# Patient Record
Sex: Female | Born: 1965
Health system: Southern US, Community
[De-identification: ages and names within clinical notes are randomized; demographics above are authoritative.]

## PROBLEM LIST (undated history)

## (undated) DIAGNOSIS — Z789 Other specified health status: Secondary | ICD-10-CM

## (undated) HISTORY — DX: Other specified health status: Z78.9

---

## 2004-05-15 ENCOUNTER — Emergency Department (HOSPITAL_COMMUNITY): Admission: EM | Admit: 2004-05-15 | Discharge: 2004-05-15 | Payer: Self-pay | Admitting: Emergency Medicine

## 2006-08-26 ENCOUNTER — Ambulatory Visit: Payer: Self-pay | Admitting: Obstetrics & Gynecology

## 2006-08-26 ENCOUNTER — Encounter: Payer: Self-pay | Admitting: Obstetrics & Gynecology

## 2006-08-26 ENCOUNTER — Encounter: Payer: Self-pay | Admitting: Obstetrics and Gynecology

## 2006-08-26 ENCOUNTER — Other Ambulatory Visit: Admission: RE | Admit: 2006-08-26 | Discharge: 2006-08-26 | Payer: Self-pay | Admitting: Obstetrics & Gynecology

## 2006-09-01 ENCOUNTER — Ambulatory Visit (HOSPITAL_COMMUNITY): Admission: RE | Admit: 2006-09-01 | Discharge: 2006-09-01 | Payer: Self-pay | Admitting: Family Medicine

## 2006-09-16 ENCOUNTER — Ambulatory Visit: Payer: Self-pay | Admitting: Obstetrics & Gynecology

## 2019-10-03 ENCOUNTER — Telehealth: Payer: Self-pay | Admitting: General Practice

## 2019-10-03 NOTE — Telephone Encounter (Signed)
Paz ok'd new patient appointment  

## 2019-10-05 ENCOUNTER — Other Ambulatory Visit: Payer: Self-pay

## 2019-10-27 ENCOUNTER — Encounter: Payer: Self-pay | Admitting: Internal Medicine

## 2019-10-27 ENCOUNTER — Ambulatory Visit: Payer: 59 | Admitting: Internal Medicine

## 2019-10-27 ENCOUNTER — Other Ambulatory Visit: Payer: Self-pay

## 2019-10-27 VITALS — BP 144/90 | HR 64 | Temp 97.8°F | Resp 16 | Ht 63.0 in | Wt 163.0 lb

## 2019-10-27 DIAGNOSIS — Z0000 Encounter for general adult medical examination without abnormal findings: Secondary | ICD-10-CM

## 2019-10-27 DIAGNOSIS — R202 Paresthesia of skin: Secondary | ICD-10-CM

## 2019-10-27 DIAGNOSIS — Z1231 Encounter for screening mammogram for malignant neoplasm of breast: Secondary | ICD-10-CM

## 2019-10-27 DIAGNOSIS — Z23 Encounter for immunization: Secondary | ICD-10-CM | POA: Diagnosis not present

## 2019-10-27 DIAGNOSIS — Z780 Asymptomatic menopausal state: Secondary | ICD-10-CM

## 2019-10-27 DIAGNOSIS — Z01419 Encounter for gynecological examination (general) (routine) without abnormal findings: Secondary | ICD-10-CM

## 2019-10-27 LAB — COMPREHENSIVE METABOLIC PANEL
ALT: 18 U/L (ref 0–35)
AST: 22 U/L (ref 0–37)
Albumin: 4.5 g/dL (ref 3.5–5.2)
Alkaline Phosphatase: 116 U/L (ref 39–117)
BUN: 11 mg/dL (ref 6–23)
CO2: 32 mEq/L (ref 19–32)
Calcium: 9.8 mg/dL (ref 8.4–10.5)
Chloride: 101 mEq/L (ref 96–112)
Creatinine, Ser: 0.53 mg/dL (ref 0.40–1.20)
GFR: 119.62 mL/min (ref >60.00)
Glucose, Bld: 91 mg/dL (ref 70–99)
Potassium: 3.8 mEq/L (ref 3.5–5.1)
Sodium: 139 mEq/L (ref 135–145)
Total Bilirubin: 0.4 mg/dL (ref 0.2–1.2)
Total Protein: 7.3 g/dL (ref 6.0–8.3)

## 2019-10-27 LAB — CBC WITH DIFFERENTIAL/PLATELET
Basophils Absolute: 0 10*3/uL (ref 0.0–0.1)
Basophils Relative: 0.8 % (ref 0.0–3.0)
Eosinophils Absolute: 0.2 10*3/uL (ref 0.0–0.7)
Eosinophils Relative: 3.6 % (ref 0.0–5.0)
HCT: 39.5 % (ref 36.0–46.0)
Hemoglobin: 13.4 g/dL (ref 12.0–15.0)
Lymphocytes Relative: 30.2 % (ref 12.0–46.0)
Lymphs Abs: 1.4 10*3/uL (ref 0.7–4.0)
MCHC: 34 g/dL (ref 30.0–36.0)
MCV: 87.3 fl (ref 78.0–100.0)
Monocytes Absolute: 0.4 10*3/uL (ref 0.1–1.0)
Monocytes Relative: 7.8 % (ref 3.0–12.0)
Neutro Abs: 2.7 10*3/uL (ref 1.4–7.7)
Neutrophils Relative %: 57.6 % (ref 43.0–77.0)
Platelets: 170 10*3/uL (ref 150.0–400.0)
RBC: 4.53 Mil/uL (ref 3.87–5.11)
RDW: 13.6 % (ref 11.5–15.5)
WBC: 4.6 10*3/uL (ref 4.0–10.5)

## 2019-10-27 LAB — B12 AND FOLATE PANEL
Folate: >24.8 ng/mL (ref >5.9)
Vitamin B-12: 848 pg/mL (ref 211–911)

## 2019-10-27 LAB — HEMOGLOBIN A1C: Hgb A1c MFr Bld: 5.7 % (ref 4.6–6.5)

## 2019-10-27 LAB — LIPID PANEL
Cholesterol: 242 mg/dL — ABNORMAL HIGH (ref 0–200)
HDL: 66 mg/dL (ref >39.00)
LDL Cholesterol: 138 mg/dL — ABNORMAL HIGH (ref 0–99)
NonHDL: 176.08
Total CHOL/HDL Ratio: 4
Triglycerides: 191 mg/dL — ABNORMAL HIGH (ref 0.0–149.0)
VLDL: 38.2 mg/dL (ref 0.0–40.0)

## 2019-10-27 LAB — VITAMIN D 25 HYDROXY (VIT D DEFICIENCY, FRACTURES): VITD: 29.47 ng/mL — ABNORMAL LOW (ref 30.00–100.00)

## 2019-10-27 LAB — TSH: TSH: 1.17 u[IU]/mL (ref 0.35–4.50)

## 2019-10-27 NOTE — Progress Notes (Signed)
   Subjective:    Patient ID: Brandy Hopkins, female    DOB: 1965-10-17, 54 y.o.   MRN: [id number]  DOS:  10/27/2019 Type of visit - description: New patient, CPX Has not seen a doctor in years. In general feels well. Several year history of occasionally numbness at feet, on and off, sometimes is positional depending if she is kneeling or sitting for long time. No claudication, no edema, from time to time has low back pain, worse on the left. She is on her menopause, denies spotting.    Review of Systems  Other than above, a 14 point review of systems is negative     Past Medical History:  Diagnosis Date  . Known health problems: none     Past Surgical History:  Procedure Laterality Date  . CESAREAN SECTION  1997    Allergies as of 10/27/2019   No Known Allergies     Medication List    as of October 27, 2019 11:59 PM   You have not been prescribed any medications.        Objective:   Physical Exam BP (!) 144/90 (BP Location: Left Arm)   Pulse 64   Temp 97.8 F (36.6 C) (Temporal)   Resp 16   Ht 5\' 3"  (1.6 m)   Wt 163 lb (73.9 kg)   SpO2 99%   BMI 28.87 kg/m  General: Well developed, NAD, BMI noted Neck: No  thyromegaly  HEENT:  Normocephalic . Face symmetric, atraumatic Lungs:  CTA B Normal respiratory effort, no intercostal retractions, no accessory muscle use. Heart: RRR,  no murmur.  Abdomen:  Not distended, soft, non-tender. No rebound or rigidity.   Lower extremities: no pretibial edema bilaterally.  Good pedal pulses Skin: Exposed areas without rash. Not pale. Not jaundice Neurologic:  alert & oriented X3.  Speech normal, gait appropriate for age and unassisted Strength symmetric and appropriate for age.  DTRs symmetric, pinprick examination distal lower extremities normal Psych: Cognition and judgment appear intact.  Cooperative with normal attention span and concentration.  Behavior appropriate. No anxious or depressed appearing.      Assessment    Assessment (new patient 10-2019 Menopausal (LMP ~ 2015)  PLAN New patient, here for CPX Paresthesias: Chronic (years), see above in addition to routine labs will check vitamin levels.  Otherwise observation BP elevated: Recheck 144/90, reassess in 6 months instructions discussed in Spanish RTC 6 months  This visit occurred during the SARS-CoV-2 public health emergency.  Safety protocols were in place, including screening questions prior to the visit, additional usage of staff PPE, and extensive cleaning of exam room while observing appropriate contact time as indicated for disinfecting solutions.

## 2019-10-27 NOTE — Patient Instructions (Signed)
TOMESE LA PRESION UNA VEZ AL MES, SU PRESION DEBE SER ENTRE 110/65 and  135/85.  ME AVISA SI NO ES NORMAL  VAYA AL LABORATORIO  SAQUE UNA CITA PARA OTRA VISITA EN 6 MESES  VAYA AL PRIMER PISO: HAGA UNA CITA PARA SU DENSIOMETRIA OSEA Y MAMOGRAMA  LA VAN A LLAMAR PRA UNA CITA CON EL GINECOLOGO  TRAIGA UNA MUESTRA DE LAS HECES    GO TO THE LAB : Get the blood work     GO TO THE FRONT DESK, PLEASE SCHEDULE YOUR APPOINTMENTS Come back for a checkup in 6 months

## 2019-10-29 ENCOUNTER — Encounter: Payer: Self-pay | Admitting: Internal Medicine

## 2019-10-29 DIAGNOSIS — Z0000 Encounter for general adult medical examination without abnormal findings: Secondary | ICD-10-CM | POA: Insufficient documentation

## 2019-10-29 NOTE — Assessment & Plan Note (Signed)
Tdap today S/p Pfizer covid x 2  rec flu shot q year CCS: no FH, for now recommend ifob Female Care : not in years, refer to gyn, order mammogram Menopausal: rx ca , vit D, DEXA rec eye check Labs: CMP, FLP, CBC, A1c, TSH, vitamin D, B12, folic acid

## 2019-10-31 MED ORDER — VITAMIN D (ERGOCALCIFEROL) 1.25 MG (50000 UNIT) PO CAPS: 50000.0000 [IU] | ORAL_CAPSULE | ORAL | 0 refills | Status: DC

## 2019-10-31 NOTE — Addendum Note (Signed)
Addended byConrad [place] D on: 10/31/2019 11:03 AM   Modules accepted: Orders

## 2019-11-07 ENCOUNTER — Ambulatory Visit (HOSPITAL_BASED_OUTPATIENT_CLINIC_OR_DEPARTMENT_OTHER)
Admission: RE | Admit: 2019-11-07 | Discharge: 2019-11-07 | Disposition: A | Discharge status: Home / Self Care | Payer: 59 | Source: Ambulatory Visit | Attending: Internal Medicine | Admitting: Internal Medicine

## 2019-11-07 ENCOUNTER — Other Ambulatory Visit: Payer: Self-pay

## 2019-11-07 ENCOUNTER — Encounter (HOSPITAL_BASED_OUTPATIENT_CLINIC_OR_DEPARTMENT_OTHER): Payer: Self-pay

## 2019-11-07 DIAGNOSIS — Z1231 Encounter for screening mammogram for malignant neoplasm of breast: Secondary | ICD-10-CM | POA: Diagnosis present

## 2019-11-07 DIAGNOSIS — Z780 Asymptomatic menopausal state: Secondary | ICD-10-CM | POA: Diagnosis present

## 2019-11-13 MED FILL — VIT D2 1.25 MG (50,000 UNIT: 1.25 MG | 28 days supply | Qty: 4 | Fill #0

## 2019-12-07 LAB — HM PAP SMEAR

## 2020-03-08 ENCOUNTER — Encounter: Payer: Self-pay | Admitting: Internal Medicine

## 2020-04-29 ENCOUNTER — Encounter: Payer: Self-pay | Admitting: Internal Medicine

## 2020-04-29 ENCOUNTER — Other Ambulatory Visit: Payer: Self-pay

## 2020-04-29 ENCOUNTER — Ambulatory Visit (INDEPENDENT_AMBULATORY_CARE_PROVIDER_SITE_OTHER): Payer: 59 | Admitting: Internal Medicine

## 2020-04-29 VITALS — BP 160/89 | HR 72 | Temp 98.0°F | Resp 16 | Ht 63.0 in | Wt 169.0 lb

## 2020-04-29 DIAGNOSIS — E559 Vitamin D deficiency, unspecified: Secondary | ICD-10-CM

## 2020-04-29 DIAGNOSIS — R030 Elevated blood-pressure reading, without diagnosis of hypertension: Secondary | ICD-10-CM

## 2020-04-29 DIAGNOSIS — G629 Polyneuropathy, unspecified: Secondary | ICD-10-CM

## 2020-04-29 DIAGNOSIS — R202 Paresthesia of skin: Secondary | ICD-10-CM | POA: Diagnosis not present

## 2020-04-29 NOTE — Progress Notes (Signed)
° °  Subjective:    Patient ID: Brandy Hopkins, female    DOB: [birth year], 55 y.o.   MRN: [id number]  DOS:  04/29/2020 Type of visit - description: Follow-up, here with her husband Since the last office visit she is doing about the same. Ambulatory BP occasionally elevated.  Denies chest pain or difficulty breathing. No edema No headaches    Review of Systems See above   Past Medical History:  Diagnosis Date   Known health problems: none     Past Surgical History:  Procedure Laterality Date   CESAREAN SECTION  1997    Allergies as of 04/29/2020   No Known Allergies     Medication List       Accurate as of April 29, 2020  4:18 PM. If you have any questions, ask your nurse or doctor.        Vitamin D (Ergocalciferol) 1.25 MG (50000 UNIT) Caps capsule Commonly known as: DRISDOL Take 1 capsule (50,000 Units total) by mouth every 7 (seven) days.          Objective:   Physical Exam BP (!) 160/89 (BP Location: Right Arm, Patient Position: Sitting, Cuff Size: Small)    Pulse 72    Temp 98 F (36.7 C) (Oral)    Resp 16    Ht 5\' 3"  (1.6 m)    Wt 169 lb (76.7 kg)    SpO2 98%    BMI 29.94 kg/m  General:   Well developed, NAD, BMI noted. HEENT:  Normocephalic . Face symmetric, atraumatic Lungs:  CTA B Normal respiratory effort, no intercostal retractions, no accessory muscle use. Heart: RRR,  no murmur.  Lower extremities: no pretibial edema bilaterally  Skin: Not pale. Not jaundice Neurologic:  alert & oriented X3.  Speech normal, gait appropriate for age and unassisted Psych--  Cognition and judgment appear intact.  Cooperative with normal attention span and concentration.  Behavior appropriate. No anxious or depressed appearing.      Assessment    Assessment (new patient 10-2019) Menopausal (LMP ~ 2015) Vitamin D deficiency   PLAN Elevated BP: BP today slightly elevated, at home range from 135-140. Plan: Continue monitoring weekly, low-salt  diet, reassess in 6 months Vit D  deficiency: See labs from few months ago, took ergocalciferol weekly for 3 months, recommend OTC vitamin D3 2000 units daily.  Recheck levels on RTC Paresthesias: At baseline Preventive care: Saw gynecology, had a Pap smear, normal mammogram and DEXA 10/2019. Had 2 Covid vaccines, benefits of booster discussed. Declined need flu vaccination. RTC 6 months    This visit occurred during the SARS-CoV-2 public health emergency.  Safety protocols were in place, including screening questions prior to the visit, additional usage of staff PPE, and extensive cleaning of exam room while observing appropriate contact time as indicated for disinfecting solutions.

## 2020-04-29 NOTE — Progress Notes (Signed)
Pre visit review using our clinic review tool, if applicable. No additional management support is needed unless otherwise documented below in the visit note. 

## 2020-04-29 NOTE — Patient Instructions (Signed)
Check the  blood pressure  weekly   BP GOAL is between 110/65 and  135/85. If it is consistently higher or lower, let me know  TOME VITAMINA D-3:  2,000 UNIDADES CADA DIA   GO TO THE FRONT DESK, PLEASE SCHEDULE YOUR APPOINTMENTS Come back for a check up in 6 months

## 2020-04-30 DIAGNOSIS — Z09 Encounter for follow-up examination after completed treatment for conditions other than malignant neoplasm: Secondary | ICD-10-CM | POA: Insufficient documentation

## 2020-04-30 NOTE — Assessment & Plan Note (Signed)
Elevated BP: BP today slightly elevated, at home range from 135-140. Plan: Continue monitoring weekly, low-salt diet, reassess in 6 months Vit D  deficiency: See labs from few months ago, took ergocalciferol weekly for 3 months, recommend OTC vitamin D3 2000 units daily.  Recheck levels on RTC Paresthesias: At baseline Preventive care: Saw gynecology, had a Pap smear, normal mammogram and DEXA 10/2019. Had 2 Covid vaccines, benefits of booster discussed. Declined need flu vaccination. RTC 6 months

## 2020-10-29 ENCOUNTER — Ambulatory Visit: Payer: 59 | Admitting: Internal Medicine

## 2020-11-19 ENCOUNTER — Other Ambulatory Visit (HOSPITAL_BASED_OUTPATIENT_CLINIC_OR_DEPARTMENT_OTHER): Payer: Self-pay

## 2020-11-19 ENCOUNTER — Encounter: Payer: Self-pay | Admitting: Internal Medicine

## 2020-11-19 ENCOUNTER — Other Ambulatory Visit: Payer: Self-pay

## 2020-11-19 ENCOUNTER — Ambulatory Visit (INDEPENDENT_AMBULATORY_CARE_PROVIDER_SITE_OTHER): Payer: 59 | Admitting: Internal Medicine

## 2020-11-19 VITALS — BP 142/98 | HR 75 | Temp 98.2°F | Resp 16 | Ht 63.0 in | Wt 169.1 lb

## 2020-11-19 DIAGNOSIS — Z1231 Encounter for screening mammogram for malignant neoplasm of breast: Secondary | ICD-10-CM

## 2020-11-19 DIAGNOSIS — E559 Vitamin D deficiency, unspecified: Secondary | ICD-10-CM

## 2020-11-19 DIAGNOSIS — I10 Essential (primary) hypertension: Secondary | ICD-10-CM

## 2020-11-19 DIAGNOSIS — R739 Hyperglycemia, unspecified: Secondary | ICD-10-CM

## 2020-11-19 DIAGNOSIS — Z1159 Encounter for screening for other viral diseases: Secondary | ICD-10-CM

## 2020-11-19 DIAGNOSIS — Z114 Encounter for screening for human immunodeficiency virus [HIV]: Secondary | ICD-10-CM

## 2020-11-19 DIAGNOSIS — Z0000 Encounter for general adult medical examination without abnormal findings: Secondary | ICD-10-CM | POA: Diagnosis not present

## 2020-11-19 MED ORDER — AMLODIPINE BESYLATE 5 MG PO TABS
5.0000 mg | ORAL_TABLET | Freq: Every day | ORAL | 1 refills | Status: DC
  Filled 2020-11-19: qty 90, 90d supply, fill #0
  Filled 2020-12-02: qty 30, 30d supply, fill #0

## 2020-11-19 NOTE — Patient Instructions (Addendum)
  Empieze amlodipine 5 mg una al dia para presion [place] la presion 2 veces x semana  110/65 and  135/85. Dieta baja de sal   En el primer piso haga una cita para su mamografia  Regrese en 3 meses para un chequeo de presion   Considere la vacuna de el flu y shingles  (culebrilla)     GO TO THE LAB : Get the blood work     GO TO THE FRONT DESK, PLEASE SCHEDULE YOUR APPOINTMENTS Come back for a checkup in 3 months

## 2020-11-19 NOTE — Progress Notes (Signed)
Subjective:    Patient ID: Brandy Hopkins, female    DOB: 06-Oct-1965, 55 y.o.   MRN: [id number]  DOS:  11/19/2020 Type of visit - description: CPX Here with her husband. Has no major concerns. Ambulatory BP slightly elevated.   BP Readings from Last 3 Encounters:  11/19/20 (!) 142/98  04/29/20 (!) 160/89  10/27/19 (!) 144/90    Review of Systems  Other than above, a 14 point review of systems is negative     Past Medical History:  Diagnosis Date   Known health problems: none     Past Surgical History:  Procedure Laterality Date   CESAREAN SECTION  1997   Social History   Socioeconomic History   Marital status: Married    Spouse name: Not on file   Number of children: 2   Years of education: Not on file   Highest education level: Not on file  Occupational History   Occupation: house wife  Tobacco Use   Smoking status: Never   Smokeless tobacco: Never  Substance and Sexual Activity   Alcohol use: Never   Drug use: Never   Sexual activity: Yes  Other Topics Concern   Not on file  Social History Narrative   From [place]   First marriage at age 36   Social Determinants of Health   Financial Resource Strain: Not on file  Food Insecurity: Not on file  Transportation Needs: Not on file  Physical Activity: Not on file  Stress: Not on file  Social Connections: Not on file  Intimate Partner Violence: Not on file    Allergies as of 11/19/2020   No Known Allergies      Medication List        Accurate as of November 19, 2020 11:59 PM. If you have any questions, ask your nurse or doctor.          amLODipine 5 MG tablet Commonly known as: NORVASC Tome 1 tableta (5 mg en total) por va oral diariamente. (Take 1 tablet (5 mg total) by mouth daily.) Started by: Willow Ora, MD           Objective:   Physical Exam BP (!) 142/98 (BP Location: Left Arm, Patient Position: Sitting, Cuff Size: Small)   Pulse 75   Temp 98.2 F (36.8 C) (Oral)    Resp 16   Ht 5\' 3"  (1.6 m)   Wt 169 lb 2 oz (76.7 kg)   SpO2 97%   BMI 29.96 kg/m  General: Well developed, NAD, BMI noted Neck: No  thyromegaly  HEENT:  Normocephalic . Face symmetric, atraumatic Lungs:  CTA B Normal respiratory effort, no intercostal retractions, no accessory muscle use. Heart: RRR,  no murmur.  Abdomen:  Not distended, soft, non-tender. No rebound or rigidity.   Lower extremities: no pretibial edema bilaterally  Skin: Exposed areas without rash. Not pale. Not jaundice Neurologic:  alert & oriented X3.  Speech normal, gait appropriate for age and unassisted Strength symmetric and appropriate for age.  Psych: Cognition and judgment appear intact.  Cooperative with normal attention span and concentration.  Behavior appropriate. No anxious or depressed appearing.     Assessment     Assessment (new patient 10-2019) Hyperglycemia HTN: Dx 10-2020 Menopausal (LMP ~ 2015) Vitamin D deficiency   PLAN Here for CPX Hyperglycemia: Check A1c HTN: BP remains moderately (but consistently) elevated.  DX HTN.  Recommend low-salt diet, start amlodipine 5 mg, reassess in 3 months. Vitamin D deficiency: On OTCs,  recheck today. All instructions discussed in Spanish RTC 3 months   This visit occurred during the SARS-CoV-2 public health emergency.  Safety protocols were in place, including screening questions prior to the visit, additional usage of staff PPE, and extensive cleaning of exam room while observing appropriate contact time as indicated for disinfecting solutions.

## 2020-11-20 ENCOUNTER — Encounter: Payer: Self-pay | Admitting: Internal Medicine

## 2020-11-20 DIAGNOSIS — E559 Vitamin D deficiency, unspecified: Secondary | ICD-10-CM | POA: Insufficient documentation

## 2020-11-20 DIAGNOSIS — I10 Essential (primary) hypertension: Secondary | ICD-10-CM | POA: Insufficient documentation

## 2020-11-20 LAB — CBC WITH DIFFERENTIAL/PLATELET
Basophils Absolute: 0.1 10*3/uL (ref 0.0–0.1)
Basophils Relative: 1.1 % (ref 0.0–3.0)
Eosinophils Absolute: 0.2 10*3/uL (ref 0.0–0.7)
Eosinophils Relative: 3.4 % (ref 0.0–5.0)
HCT: 38.4 % (ref 36.0–46.0)
Hemoglobin: 12.8 g/dL (ref 12.0–15.0)
Lymphocytes Relative: 33 % (ref 12.0–46.0)
Lymphs Abs: 1.8 10*3/uL (ref 0.7–4.0)
MCHC: 33.5 g/dL (ref 30.0–36.0)
MCV: 86.1 fl (ref 78.0–100.0)
Monocytes Absolute: 0.4 10*3/uL (ref 0.1–1.0)
Monocytes Relative: 6.8 % (ref 3.0–12.0)
Neutro Abs: 3.1 10*3/uL (ref 1.4–7.7)
Neutrophils Relative %: 55.7 % (ref 43.0–77.0)
Platelets: 182 10*3/uL (ref 150.0–400.0)
RBC: 4.46 Mil/uL (ref 3.87–5.11)
RDW: 13.6 % (ref 11.5–15.5)
WBC: 5.6 10*3/uL (ref 4.0–10.5)

## 2020-11-20 LAB — LIPID PANEL
Cholesterol: 246 mg/dL — ABNORMAL HIGH (ref 0–200)
HDL: 64.5 mg/dL (ref >39.00)
LDL Cholesterol: 150 mg/dL — ABNORMAL HIGH (ref 0–99)
NonHDL: 181.96
Total CHOL/HDL Ratio: 4
Triglycerides: 162 mg/dL — ABNORMAL HIGH (ref 0.0–149.0)
VLDL: 32.4 mg/dL (ref 0.0–40.0)

## 2020-11-20 LAB — COMPREHENSIVE METABOLIC PANEL
ALT: 17 U/L (ref 0–35)
AST: 21 U/L (ref 0–37)
Albumin: 4.4 g/dL (ref 3.5–5.2)
Alkaline Phosphatase: 130 U/L — ABNORMAL HIGH (ref 39–117)
BUN: 13 mg/dL (ref 6–23)
CO2: 32 mEq/L (ref 19–32)
Calcium: 9.5 mg/dL (ref 8.4–10.5)
Chloride: 103 mEq/L (ref 96–112)
Creatinine, Ser: 0.58 mg/dL (ref 0.40–1.20)
GFR: 101.18 mL/min (ref >60.00)
Glucose, Bld: 88 mg/dL (ref 70–99)
Potassium: 3.7 mEq/L (ref 3.5–5.1)
Sodium: 141 mEq/L (ref 135–145)
Total Bilirubin: 0.3 mg/dL (ref 0.2–1.2)
Total Protein: 7.2 g/dL (ref 6.0–8.3)

## 2020-11-20 LAB — HEMOGLOBIN A1C: Hgb A1c MFr Bld: 5.7 % (ref 4.6–6.5)

## 2020-11-20 LAB — HIV ANTIBODY (ROUTINE TESTING W REFLEX): HIV 1&2 Ab, 4th Generation: NONREACTIVE

## 2020-11-20 LAB — HEPATITIS C ANTIBODY
Hepatitis C Ab: NONREACTIVE
SIGNAL TO CUT-OFF: 0.01 (ref <1.00)

## 2020-11-20 LAB — VITAMIN D 25 HYDROXY (VIT D DEFICIENCY, FRACTURES): VITD: 37.17 ng/mL (ref 30.00–100.00)

## 2020-11-20 NOTE — Assessment & Plan Note (Signed)
Here for CPX Hyperglycemia: Check A1c HTN: BP remains moderately (but consistently) elevated.  DX HTN.  Recommend low-salt diet, start amlodipine 5 mg, reassess in 3 months. Vitamin D deficiency: On OTCs, recheck today. All instructions discussed in Spanish RTC 3 months

## 2020-11-20 NOTE — Assessment & Plan Note (Signed)
Tdap 2021 S/p Pfizer covid x 2, rec booster Shingrix recommended rec flu shot q year CCS: no FH, options d/w and her husband, elected  ifob Female Care : PAP 11-2019, MMG 10-2019.  MMG order entered. Menopausal: rx ca , vit D, DEXA 10/2019 WNL Labs: CMP, FLP, vitamin D, CBC, A1c, hep C, HIV, I fob  Diet and exercise discussed

## 2020-11-27 ENCOUNTER — Other Ambulatory Visit (HOSPITAL_BASED_OUTPATIENT_CLINIC_OR_DEPARTMENT_OTHER): Payer: Self-pay

## 2020-11-29 ENCOUNTER — Other Ambulatory Visit (INDEPENDENT_AMBULATORY_CARE_PROVIDER_SITE_OTHER): Payer: 59

## 2020-11-29 DIAGNOSIS — Z0000 Encounter for general adult medical examination without abnormal findings: Secondary | ICD-10-CM | POA: Diagnosis not present

## 2020-11-29 LAB — FECAL OCCULT BLOOD, IMMUNOCHEMICAL: Fecal Occult Bld: NEGATIVE

## 2020-12-02 ENCOUNTER — Other Ambulatory Visit (HOSPITAL_BASED_OUTPATIENT_CLINIC_OR_DEPARTMENT_OTHER): Payer: Self-pay

## 2020-12-10 ENCOUNTER — Other Ambulatory Visit: Payer: Self-pay

## 2020-12-10 ENCOUNTER — Ambulatory Visit (HOSPITAL_BASED_OUTPATIENT_CLINIC_OR_DEPARTMENT_OTHER)
Admission: RE | Admit: 2020-12-10 | Discharge: 2020-12-10 | Disposition: A | Discharge status: Home / Self Care | Payer: 59 | Source: Ambulatory Visit | Attending: Internal Medicine | Admitting: Internal Medicine

## 2020-12-10 ENCOUNTER — Encounter (HOSPITAL_BASED_OUTPATIENT_CLINIC_OR_DEPARTMENT_OTHER): Payer: Self-pay

## 2020-12-10 DIAGNOSIS — Z1231 Encounter for screening mammogram for malignant neoplasm of breast: Secondary | ICD-10-CM | POA: Insufficient documentation

## 2021-02-19 ENCOUNTER — Ambulatory Visit: Payer: 59 | Admitting: Internal Medicine

## 2021-02-19 ENCOUNTER — Encounter: Payer: Self-pay | Admitting: Internal Medicine

## 2021-03-04 ENCOUNTER — Other Ambulatory Visit (HOSPITAL_BASED_OUTPATIENT_CLINIC_OR_DEPARTMENT_OTHER): Payer: Self-pay

## 2021-03-04 ENCOUNTER — Other Ambulatory Visit: Payer: Self-pay

## 2021-03-04 ENCOUNTER — Ambulatory Visit (INDEPENDENT_AMBULATORY_CARE_PROVIDER_SITE_OTHER): Payer: 59 | Admitting: Internal Medicine

## 2021-03-04 VITALS — BP 142/81 | HR 62 | Temp 98.3°F | Resp 12 | Ht 63.0 in | Wt 163.4 lb

## 2021-03-04 DIAGNOSIS — I10 Essential (primary) hypertension: Secondary | ICD-10-CM | POA: Diagnosis not present

## 2021-03-04 DIAGNOSIS — R7989 Other specified abnormal findings of blood chemistry: Secondary | ICD-10-CM | POA: Diagnosis not present

## 2021-03-04 MED ORDER — LOSARTAN POTASSIUM 50 MG PO TABS
50.0000 mg | ORAL_TABLET | Freq: Every day | ORAL | 0 refills | Status: DC
  Filled 2021-03-04: qty 30, 30d supply, fill #0

## 2021-03-04 NOTE — Progress Notes (Signed)
   Subjective:    Patient ID: Brandy Hopkins, female    DOB: 1966/04/07, 55 y.o.   MRN: [id number]  DOS:  03/04/2021 Type of visit - description: f/u  Since the last office visit, was rx  amlodipine for hypertension. Took it only for 1 month, developed paresthesias at B arms  and cramps at the calves; while on amlodipine, BPs were good.  She stopped amlodipine and all "side effects" went away. Denies any lower extremity edema.  BP Readings from Last 3 Encounters:  03/04/21 (!) 142/81  11/19/20 (!) 142/98  04/29/20 (!) 160/89     Review of Systems See above   Past Medical History:  Diagnosis Date   Known health problems: none     Past Surgical History:  Procedure Laterality Date   CESAREAN SECTION  1997    Allergies as of 03/04/2021   No Known Allergies      Medication List        Accurate as of March 04, 2021  7:20 PM. If you have any questions, ask your nurse or doctor.          amLODipine 5 MG tablet Commonly known as: NORVASC Tome 1 tableta (5 mg en total) por va oral diariamente. (Take 1 tablet (5 mg total) by mouth daily.)   losartan 50 MG tablet Commonly known as: COZAAR Tome 1 tableta (50 mg en total) por va oral diariamente. (Take 1 tablet (50 mg total) by mouth daily.) Started by: Willow Ora, MD           Objective:   Physical Exam BP (!) 142/81 (BP Location: Right Arm, Cuff Size: Large)   Pulse 62   Temp 98.3 F (36.8 C) (Oral)   Resp 12   Ht 5\' 3"  (1.6 m)   Wt 163 lb 6.4 oz (74.1 kg)   SpO2 98%   BMI 28.95 kg/m  General:   Well developed, NAD, BMI noted. HEENT:  Normocephalic . Face symmetric, atraumatic Lungs:  CTA B Normal respiratory effort, no intercostal retractions, no accessory muscle use. Heart: RRR,  no murmur.  Lower extremities: no pretibial edema bilaterally  Skin: Not pale. Not jaundice Neurologic:  alert & oriented X3.  Speech normal, gait appropriate for age and unassisted Psych--  Cognition and  judgment appear intact.  Cooperative with normal attention span and concentration.  Behavior appropriate. No anxious or depressed appearing.      Assessment    Assessment (new patient 10-2019) Hyperglycemia HTN: Dx 10-2020 Menopausal (LMP ~ 2015) Vitamin D deficiency   PLAN HTN: See last visit, started amlodipine, took it for a month, self stopped because perceived side effects (bilateral arm numbness, calves pain).  Sxs stopped after she is self DC amlodipine BP remains slightly above target. Plan: Losartan 50 mg, BMP in 2 weeks, monitor BPs Increase alkaline phosphatase: See last labs,  re check LFTs Preventive care: Declined a flu shot, encouraged to think about a COVID booster.  She is hesitant. RTC labs 2 weeks RTC office visit 4 months   This visit occurred during the SARS-CoV-2 public health emergency.  Safety protocols were in place, including screening questions prior to the visit, additional usage of staff PPE, and extensive cleaning of exam room while observing appropriate contact time as indicated for disinfecting solutions.

## 2021-03-04 NOTE — Patient Instructions (Signed)
Start losartan 50 mg 1 tablet daily  Monitor your blood pressures  Come back in 2 weeks for blood work  Come back for office visit in 2 months.  Make an appointment at the front desk   ======  Tome losartan 50 mg una pastilla al dia  Tomese la presion 3 veces por semana debe estar entre 110/65 y 135/85  Vaya a el front desk:  Saque uba cita para tomarse Risk manager en 2 semanas  Saque una cita para verme de nuevo en 4 meses

## 2021-03-04 NOTE — Assessment & Plan Note (Signed)
HTN: See last visit, started amlodipine, took it for a month, self stopped because perceived side effects (bilateral arm numbness, calves pain).  Sxs stopped after she is self DC amlodipine BP remains slightly above target. Plan: Losartan 50 mg, BMP in 2 weeks, monitor BPs Increase alkaline phosphatase: See last labs,  re check LFTs Preventive care: Declined a flu shot, encouraged to think about a COVID booster.  She is hesitant. RTC labs 2 weeks RTC office visit 4 months

## 2021-03-26 ENCOUNTER — Other Ambulatory Visit (INDEPENDENT_AMBULATORY_CARE_PROVIDER_SITE_OTHER): Payer: 59

## 2021-03-26 DIAGNOSIS — R7989 Other specified abnormal findings of blood chemistry: Secondary | ICD-10-CM

## 2021-03-26 DIAGNOSIS — I10 Essential (primary) hypertension: Secondary | ICD-10-CM | POA: Diagnosis not present

## 2021-03-27 LAB — HEPATIC FUNCTION PANEL
ALT: 21 U/L (ref 0–35)
AST: 20 U/L (ref 0–37)
Albumin: 4.4 g/dL (ref 3.5–5.2)
Alkaline Phosphatase: 131 U/L — ABNORMAL HIGH (ref 39–117)
Bilirubin, Direct: 0 mg/dL (ref 0.0–0.3)
Total Bilirubin: 0.3 mg/dL (ref 0.2–1.2)
Total Protein: 7.1 g/dL (ref 6.0–8.3)

## 2021-03-27 LAB — BASIC METABOLIC PANEL
BUN: 14 mg/dL (ref 6–23)
CO2: 30 mEq/L (ref 19–32)
Calcium: 9.7 mg/dL (ref 8.4–10.5)
Chloride: 102 mEq/L (ref 96–112)
Creatinine, Ser: 0.64 mg/dL (ref 0.40–1.20)
GFR: 98.57 mL/min (ref >60.00)
Glucose, Bld: 77 mg/dL (ref 70–99)
Potassium: 3.8 mEq/L (ref 3.5–5.1)
Sodium: 138 mEq/L (ref 135–145)

## 2021-03-27 LAB — GAMMA GT: GGT: 14 U/L (ref 7–51)

## 2021-04-01 ENCOUNTER — Telehealth: Payer: Self-pay | Admitting: Internal Medicine

## 2021-04-02 ENCOUNTER — Other Ambulatory Visit (HOSPITAL_BASED_OUTPATIENT_CLINIC_OR_DEPARTMENT_OTHER): Payer: Self-pay

## 2021-04-02 MED ORDER — AMLODIPINE BESYLATE 5 MG PO TABS
5.0000 mg | ORAL_TABLET | Freq: Every day | ORAL | 1 refills | Status: AC
  Filled 2021-04-02: qty 30, 30d supply, fill #0

## 2021-04-02 MED ORDER — LOSARTAN POTASSIUM 50 MG PO TABS
50.0000 mg | ORAL_TABLET | Freq: Every day | ORAL | 1 refills | Status: DC
  Filled 2021-04-02: qty 30, 30d supply, fill #0

## 2021-04-02 NOTE — Addendum Note (Signed)
Addended by: [place] D on: 04/02/2021 10:00 AM   Modules accepted: Orders

## 2021-04-09 ENCOUNTER — Other Ambulatory Visit (HOSPITAL_BASED_OUTPATIENT_CLINIC_OR_DEPARTMENT_OTHER): Payer: Self-pay

## 2021-04-28 NOTE — Telephone Encounter (Signed)
error 

## 2021-07-04 ENCOUNTER — Encounter: Payer: Self-pay | Admitting: Internal Medicine

## 2021-07-04 ENCOUNTER — Ambulatory Visit: Payer: Self-pay | Admitting: Internal Medicine

## 2021-09-15 ENCOUNTER — Encounter: Payer: Self-pay | Admitting: Internal Medicine

## 2021-09-27 IMAGING — MG MM DIGITAL SCREENING BILAT W/ TOMO AND CAD
8 series · 8 of 24 positions shown · non-contrast
Comparison: Previous exam(s).

CLINICAL DATA: Screening.

EXAM:
DIGITAL SCREENING BILATERAL MAMMOGRAM WITH TOMOSYNTHESIS AND CAD
TECHNIQUE: Bilateral screening digital craniocaudal and mediolateral oblique
mammograms were obtained. Bilateral screening digital breast
tomosynthesis was performed. The images were evaluated with
computer-aided detection.

[R MLO synth-2D]
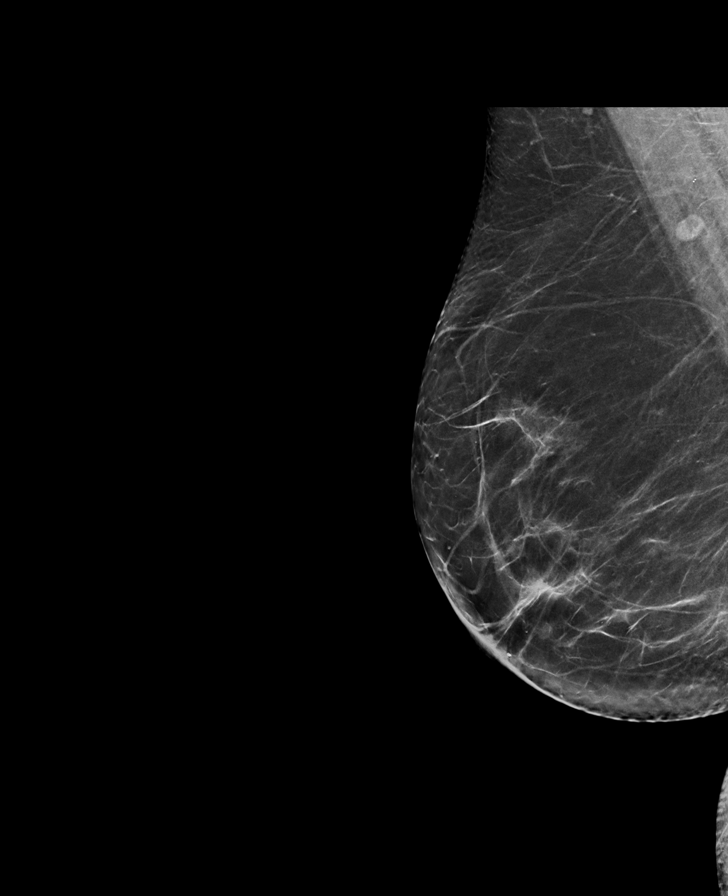

[L MLO synth-2D]
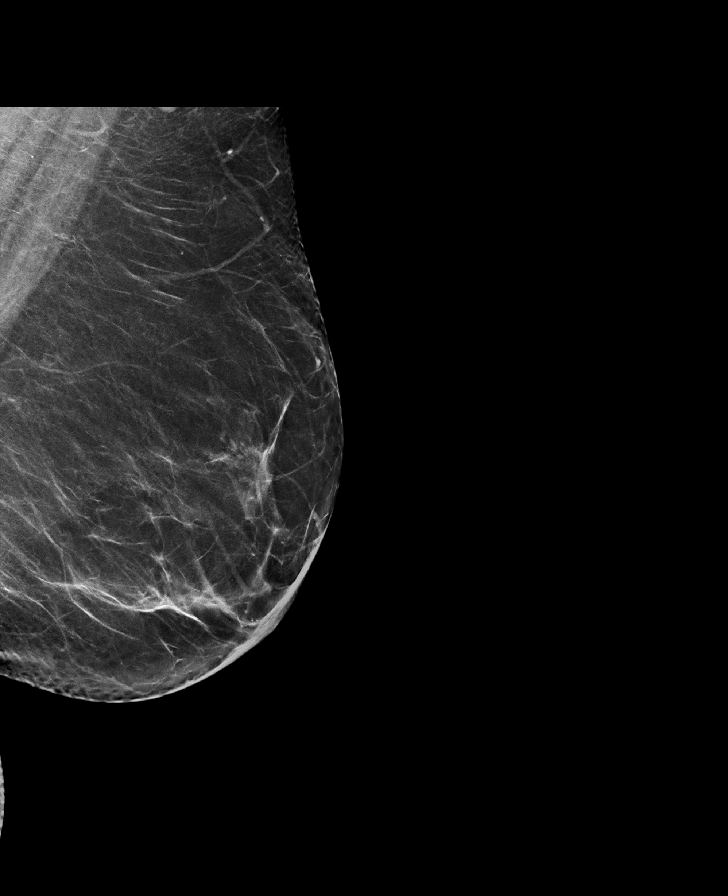

[L CC synth-2D]
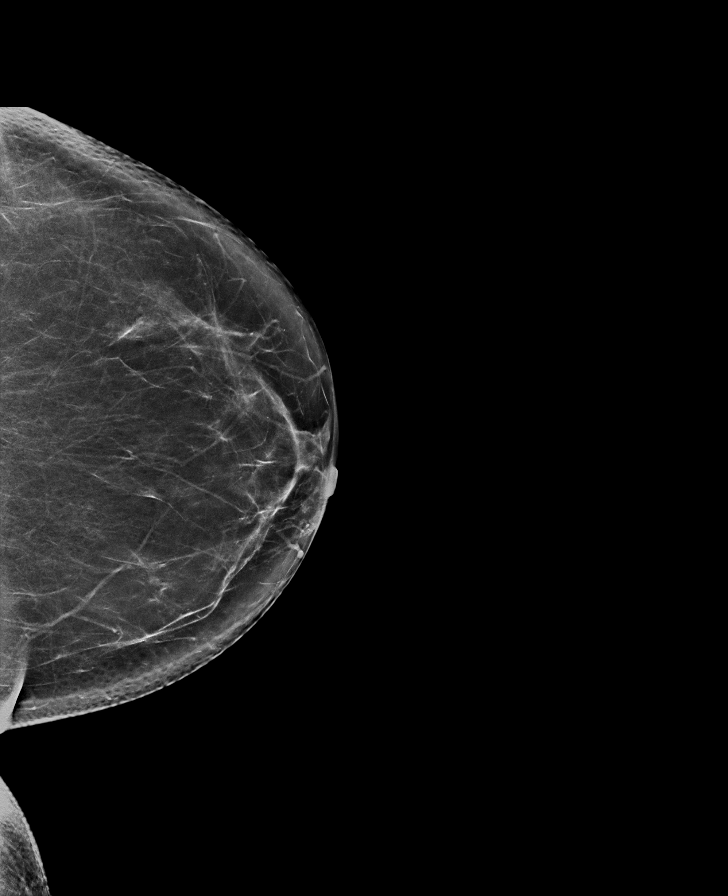

[R CC synth-2D]
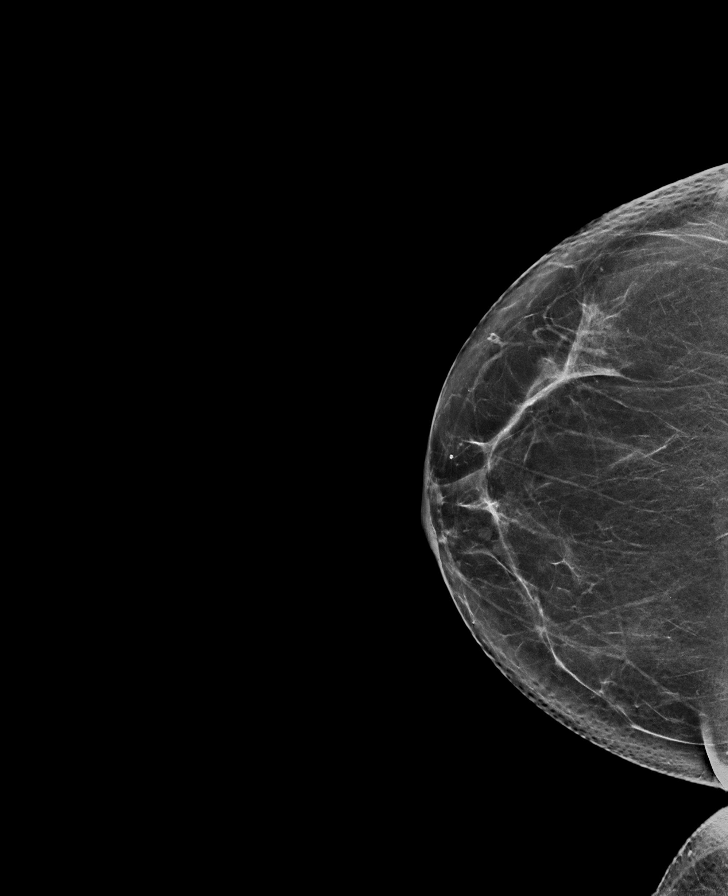

[R CC tomo · tomo slice 35/69.0]
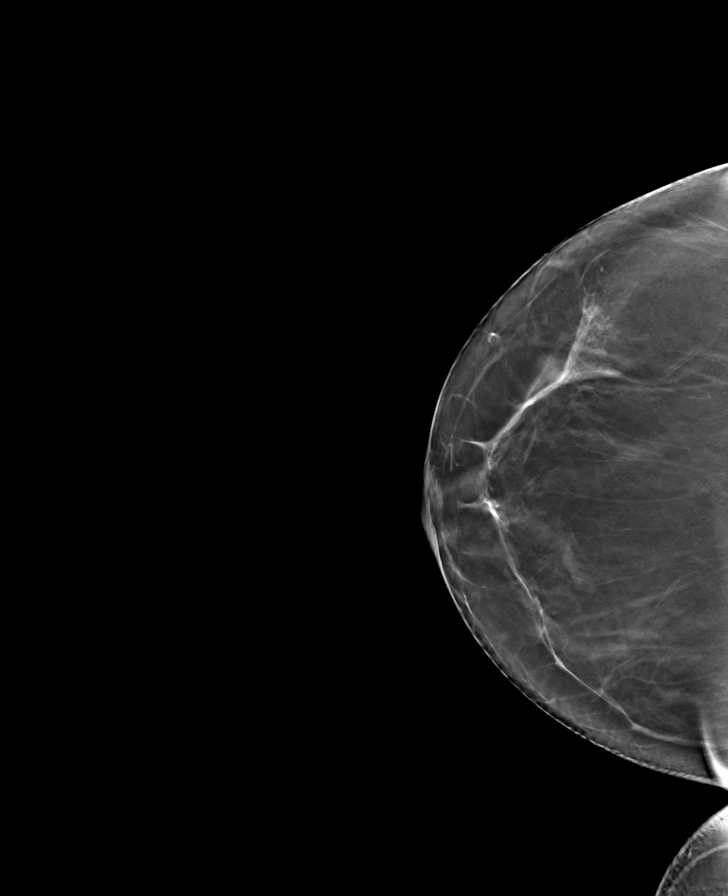

[R MLO tomo · tomo slice 37/72.0]
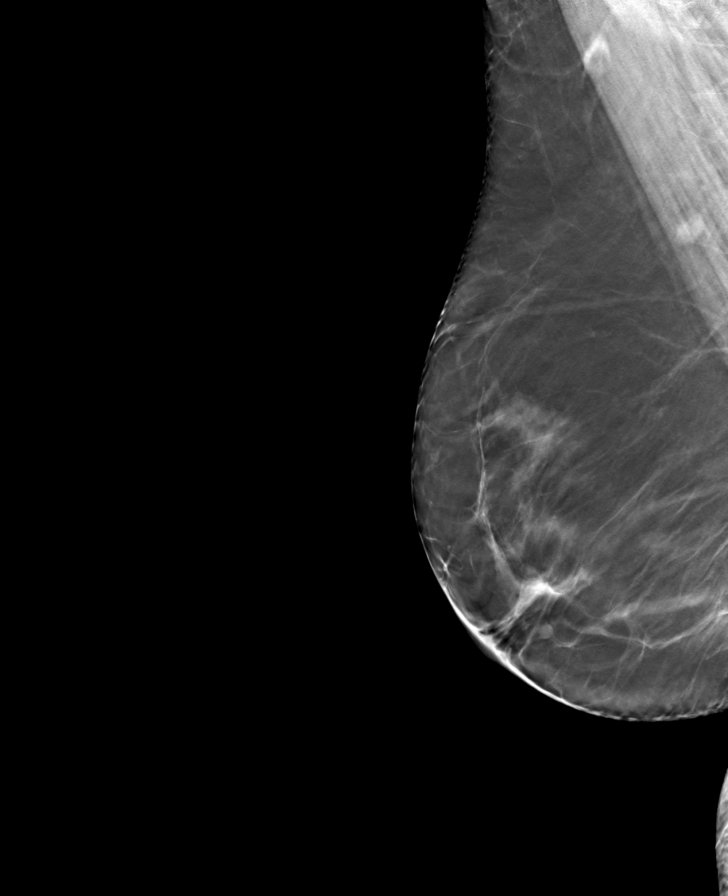

[L CC tomo · tomo slice 37/74.0]
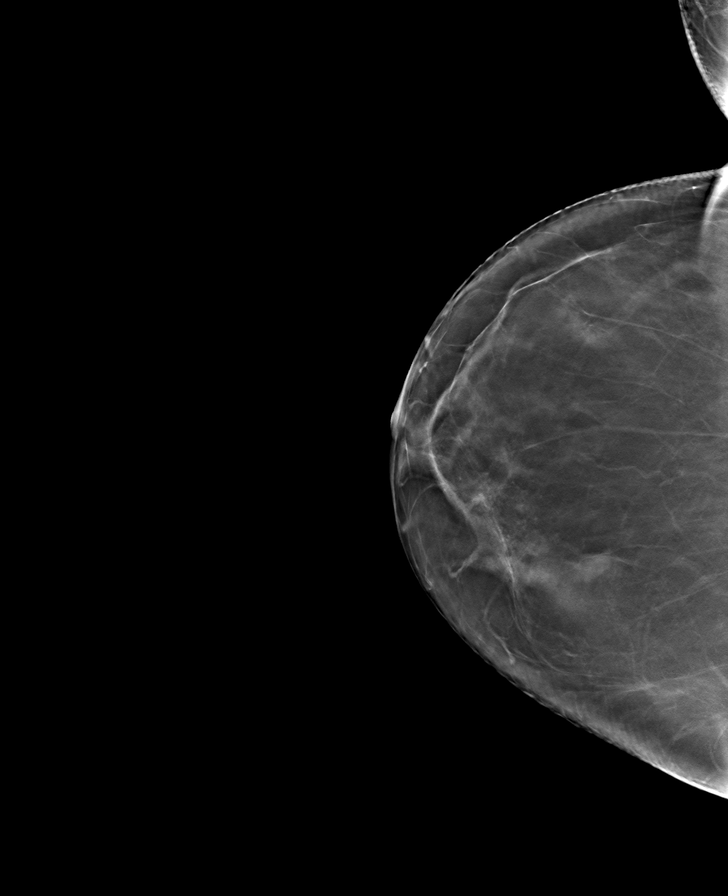

[L MLO tomo · tomo slice 40/79.0]
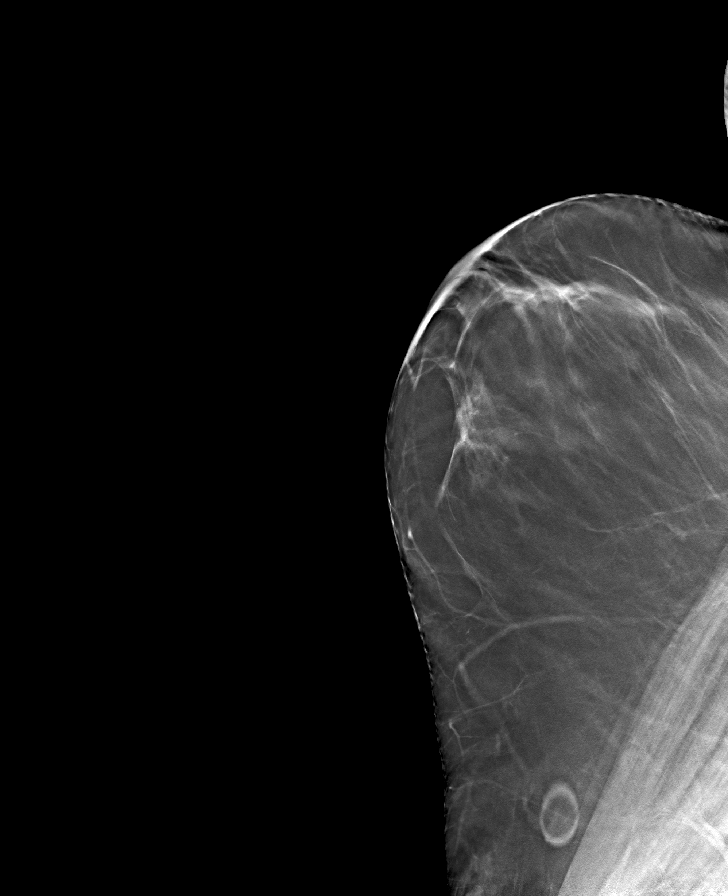

[8 of 24 positions shown; findings below may reference images not displayed]

ACR Breast Density Category b: There are scattered areas of
fibroglandular density.
FINDINGS: There are no findings suspicious for malignancy.
IMPRESSION: No mammographic evidence of malignancy. A result letter of this
screening mammogram will be mailed directly to the patient.

RECOMMENDATION:
Screening mammogram in one year. (Code:51-O-LD2)

BI-RADS CATEGORY  1: Negative.

## 2021-11-21 ENCOUNTER — Encounter: Payer: 59 | Admitting: Internal Medicine

## 2021-12-22 ENCOUNTER — Telehealth: Payer: Self-pay

## 2021-12-22 NOTE — Telephone Encounter (Signed)
Pt overdue for CPX- will you try calling her to schedule at her convenience please?

## 2021-12-22 NOTE — Telephone Encounter (Signed)
LVM in spanish for the patient to call back and schedule CPE.

## 2022-12-18 ENCOUNTER — Telehealth: Payer: Self-pay | Admitting: Internal Medicine

## 2022-12-18 NOTE — Telephone Encounter (Signed)
 Patient last seen in 2022- left voicemail for patient to schedule annual physical

## 2023-07-30 ENCOUNTER — Other Ambulatory Visit: Payer: Self-pay | Admitting: Obstetrics and Gynecology

## 2023-07-30 ENCOUNTER — Telehealth: Payer: Self-pay

## 2023-07-30 DIAGNOSIS — Z1231 Encounter for screening mammogram for malignant neoplasm of breast: Secondary | ICD-10-CM

## 2023-07-30 NOTE — Telephone Encounter (Addendum)
 Telephoned patient at mobile number using interpreter#[id number]. Left a voice message with [facility] (scholarship) contact information. Patient needs to complete scholarship for event on mobile unit.  Telephoned patient using interpreter, Brandy Hopkins. Left a voice message with [facility] contact information.

## 2023-10-07 ENCOUNTER — Ambulatory Visit
Admission: RE | Admit: 2023-10-07 | Discharge: 2023-10-07 | Disposition: A | Discharge status: Home / Self Care | Payer: Self-pay | Source: Ambulatory Visit | Attending: Obstetrics and Gynecology | Admitting: Obstetrics and Gynecology

## 2023-10-07 ENCOUNTER — Ambulatory Visit: Payer: Self-pay | Admitting: *Deleted

## 2023-10-07 VITALS — BP 180/110 | Ht 63.0 in | Wt 160.8 lb

## 2023-10-07 DIAGNOSIS — Z1231 Encounter for screening mammogram for malignant neoplasm of breast: Secondary | ICD-10-CM

## 2023-10-07 DIAGNOSIS — Z01419 Encounter for gynecological examination (general) (routine) without abnormal findings: Secondary | ICD-10-CM

## 2023-10-07 DIAGNOSIS — Z1211 Encounter for screening for malignant neoplasm of colon: Secondary | ICD-10-CM

## 2023-10-07 NOTE — Patient Instructions (Signed)
 Explained breast self awareness with Murtis Arthur -Claria Crofts. Pap smear completed today. Let her know that her next Pap smear will be based on the result of today's Pap smear due to her last Pap smear was HPV positive. Referred patient to the [facility] for a screening mammogram on mobile unit. Appointment scheduled Thursday, October 07, 2023 at 1100. Patient aware of appointment and will be there. Let patient know will follow up with her within the next couple weeks with results of Pap smear by letter or phone. Informed patient that the Breast Center will follow up with her within the next couple of weeks with results of her mammogram by letter or phone. Murtis Arthur Claria Crofts verbalized understanding.  Saadia Dewitt, Dela Favor, RN 11:08 AM

## 2023-10-07 NOTE — Progress Notes (Signed)
 Ms. Brandy Hopkins -Brandy Hopkins is a 58 y.o. No obstetric history on file. female who presents to [facility] clinic today with no complaints.    Pap Smear: Pap smear completed today. Last Pap smear was 12/07/2019 at [facility] clinic and was normal with positive HPV. Per patient has no history of an abnormal Pap smear prior to her most recent Pap smear. Last Pap smear result is available in Epic.   Physical exam: Breasts Breasts symmetrical. No skin abnormalities bilateral breasts. No nipple retraction bilateral breasts. No nipple discharge bilateral breasts. No lymphadenopathy. No lumps palpated bilateral breasts. No complaints of pain or tenderness on exam.  MM 3D SCREEN BREAST BILATERAL Result Date: 12/11/2020 CLINICAL DATA:  Screening. EXAM: DIGITAL SCREENING BILATERAL MAMMOGRAM WITH TOMOSYNTHESIS AND CAD TECHNIQUE: Bilateral screening digital craniocaudal and mediolateral oblique mammograms were obtained. Bilateral screening digital breast tomosynthesis was performed. The images were evaluated with computer-aided detection. COMPARISON:  Previous exam(s). ACR Breast Density Category b: There are scattered areas of fibroglandular density. FINDINGS: There are no findings suspicious for malignancy. IMPRESSION: No mammographic evidence of malignancy. A result letter of this screening mammogram will be mailed directly to the patient. RECOMMENDATION: Screening mammogram in one year. (Code:SM-B-01Y) BI-RADS CATEGORY  1: Negative. Electronically Signed   By: Brandy Hopkins M.D.   On: 12/11/2020 14:52   MM 3D SCREEN BREAST BILATERAL Result Date: 11/10/2019 CLINICAL DATA:  Screening. EXAM: DIGITAL SCREENING BILATERAL MAMMOGRAM WITH TOMO AND CAD COMPARISON:  None. ACR Breast Density Category b: There are scattered areas of fibroglandular density. FINDINGS: There are no findings suspicious for malignancy. Images were processed with CAD. IMPRESSION: No mammographic evidence of malignancy. A result letter of this  screening mammogram will be mailed directly to the patient. RECOMMENDATION: Screening mammogram in one year. (Code:SM-B-01Y) BI-RADS CATEGORY  1: Negative. Electronically Signed   By: Brandy Hopkins M.D.   On: 11/10/2019 12:18       Pelvic/Bimanual Ext Genitalia No lesions, no swelling and no discharge observed on external genitalia.        Vagina Vagina pink and normal texture. No lesions or discharge observed in vagina.        Cervix Cervix is present. Cervix pink and of normal texture. No discharge observed.    Uterus Uterus is present and palpable. Uterus in normal position and normal size.        Adnexae Bilateral ovaries present and palpable. No tenderness on palpation.         Rectovaginal No rectal exam completed today since patient had no rectal complaints. No skin abnormalities observed on exam.     Smoking History: Patient has never smoked.   Patient Navigation: Patient education provided. Access to services provided for patient through [place] program. Spanish interpreter Brandy Hopkins from [facility] provided.   Colorectal Cancer Screening: Per patient has never had colonoscopy completed. FIT Test given to patient to complete. No complaints today.    Breast and Cervical Cancer Risk Assessment: Patient does not have family history of breast cancer, known genetic mutations, or radiation treatment to the chest before age 56. Patient does not have history of cervical dysplasia, immunocompromised, or DES exposure in-utero.  Risk Scores as of Encounter on 10/07/2023     Brandy Hopkins           5-year 1%   Lifetime 5.47%   This patient is [place]/Latina but has no documented birth country, so the [place] [facility] model used data from [place] patients to calculate their risk score. Document a birth country  in the Demographics activity for a more accurate score.         Last calculated by Brandy Edge, RN on 10/07/2023 at 11:19 AM       A: [facility] exam with pap smear No  complaints.  P: Referred patient to the [facility] for a screening mammogram on mobile unit. Appointment scheduled Thursday, October 07, 2023 at 1100.  Brandy Edge, RN 10/07/2023 11:08 AM

## 2023-10-13 ENCOUNTER — Ambulatory Visit: Payer: Self-pay

## 2023-10-13 LAB — CYTOLOGY - PAP
Comment: NEGATIVE
Comment: NEGATIVE
Comment: NEGATIVE
Diagnosis: UNDETERMINED — AB
HPV 16: NEGATIVE
HPV 18 / 45: NEGATIVE
High risk HPV: POSITIVE — AB

## 2023-10-13 NOTE — Telephone Encounter (Signed)
Left message for patient via Erika McReynolds, [employer] about lab results. Left name and number for patient to call back. 

## 2023-10-15 ENCOUNTER — Telehealth: Payer: Self-pay

## 2023-10-15 NOTE — Telephone Encounter (Signed)
 Spoke with patient via Herma Longest, [employer] to give pap results. Informed patient that pap showed ASCUS, +HPV. Based on this result the recommendation is for a colpo for FU. Patient is scheduled for colpo with [facility] on 10/19/23 @ 1:45 pm. Patient voiced understanding.

## 2023-10-19 ENCOUNTER — Ambulatory Visit: Payer: Self-pay | Admitting: Hematology and Oncology

## 2023-10-19 ENCOUNTER — Other Ambulatory Visit (HOSPITAL_COMMUNITY)
Admission: RE | Admit: 2023-10-19 | Discharge: 2023-10-19 | Disposition: A | Discharge status: Home / Self Care | Payer: Self-pay | Source: Ambulatory Visit | Attending: Obstetrics and Gynecology | Admitting: Obstetrics and Gynecology

## 2023-10-19 VITALS — BP 147/99 | Wt 159.0 lb

## 2023-10-19 DIAGNOSIS — R87610 Atypical squamous cells of undetermined significance on cytologic smear of cervix (ASC-US): Secondary | ICD-10-CM

## 2023-10-19 DIAGNOSIS — R87810 Cervical high risk human papillomavirus (HPV) DNA test positive: Secondary | ICD-10-CM | POA: Insufficient documentation

## 2023-10-19 NOTE — Progress Notes (Signed)
 Brandy Hopkins is a 58 y.o. female who presents to [facility] clinic today with no complaints .    Pap Smear: Pap not smear completed today. Last Pap smear was 10/07/2023 and was abnormal - ASCUS/ HPV+. Per patient has history of an abnormal Pap smear. Last Pap smear result is available in Epic. 12/07/2019 - Normal/ HPV-   Physical exam: Breasts Breasts symmetrical. No skin abnormalities bilateral breasts. No nipple retraction bilateral breasts. No nipple discharge bilateral breasts. No lymphadenopathy. No lumps palpated bilateral breasts.       Pelvic/Bimanual Pap is not indicated today   GYNECOLOGY CLINIC COLPOSCOPY PROCEDURE NOTE  Brandy Hopkins is a 59 y.o. No obstetric history on file. here for colposcopy for atypical squamous cellularity of undetermined significance (ASCUS) pap smear on 10/07/2023. Discussed role for HPV in cervical dysplasia, need for surveillance.  Patient given informed consent, signed copy in the chart, time out was performed.  Placed in lithotomy position. Cervix viewed with speculum and colposcope after application of acetic acid.   Colposcopy adequate? Yes  no visible lesions.  ECC specimen obtained. All specimens were labelled and sent to pathology.  Patient was given post procedure instructions.  Will follow up pathology and manage accordingly.  Routine preventative health maintenance measures emphasized.   Harl Setter A, NP 10/19/2023 2:33 PM     Smoking History: Patient has never smoked and was not referred to quit line.    Patient Navigation: Patient education provided. Access to services provided for patient through [facility]CP program. Bernice Angry interpreter provided. No transportation provided   Colorectal Cancer Screening: Per patient has never had colonoscopy completed No complaints today.    Breast and Cervical Cancer Risk Assessment: Patient does not have family history of breast cancer, known genetic mutations,  or radiation treatment to the chest before age 51. Patient does not have history of cervical dysplasia, immunocompromised, or DES exposure in-utero.  Risk Scores as of Encounter on 10/19/2023     Alisa as of 10/07/2023           5-year 1%   Lifetime 5.47%   This patient is His[place]/Latina but has no documented birth country, so the [place] model used data from [place] patients to calculate their risk score. Document a birth country in the Demographics activity for a more accurate score.         Last calculated by Driscilla Wanda SQUIBB, RN on 10/07/2023 at 11:19 AM        A: B[facility]CP exam without pap smear No complaints with benign exam. Colposcopy as above.   P: Pending pathology, we will repeat Pap in one year if low grade and refer to gynecology if high grade.   Harl Setter A, NP 10/19/2023 1:48 PM

## 2023-10-20 LAB — FECAL OCCULT BLOOD, IMMUNOCHEMICAL: Fecal Occult Bld: NEGATIVE

## 2023-10-21 LAB — SURGICAL PATHOLOGY

## 2023-11-01 ENCOUNTER — Other Ambulatory Visit: Payer: Self-pay

## 2023-11-01 ENCOUNTER — Ambulatory Visit: Payer: Self-pay

## 2023-11-02 ENCOUNTER — Ambulatory Visit: Payer: Self-pay

## 2023-11-02 DIAGNOSIS — R87613 High grade squamous intraepithelial lesion on cytologic smear of cervix (HGSIL): Secondary | ICD-10-CM

## 2023-11-02 NOTE — Telephone Encounter (Signed)
 Using [employer], [place]: [id number], I have left a message for this pt requesting a return call to review her COLPO results and the follow-up recommendation.

## 2023-11-03 ENCOUNTER — Other Ambulatory Visit: Payer: Self-pay

## 2023-11-03 ENCOUNTER — Inpatient Hospital Stay: Payer: Self-pay | Attending: Obstetrics and Gynecology | Admitting: *Deleted

## 2023-11-03 VITALS — BP 147/86 | Ht 63.0 in | Wt 159.5 lb

## 2023-11-03 DIAGNOSIS — Z0000 Encounter for general adult medical examination without abnormal findings: Secondary | ICD-10-CM

## 2023-11-03 NOTE — Progress Notes (Signed)
 [facility]woman initial screening   Interpreter- Alfonso Rives, [place]   Clinical Measurement:  Vitals:   11/03/23 0954 11/03/23 1015  BP: 137/82 (!) 147/86   Fasting Labs Drawn Today, will review with patient when they result.   Medical History: Patient states that she has high cholesterol, has high blood pressure and she does not have diabetes. Patient states that she does not have history of gestational hypertension, does not have history of pre-eclampsia/eclampsia and she does not have history of gestational diabetes.    Medications: Patient states that she does not take medication to lower cholesterol, blood pressure or blood sugar.  Patient does not take an aspirin a day to help prevent a heart attack or stroke.    Blood pressure, self measurement: Patient states that she does measure blood pressure from home. She checks her blood pressure monthly. She shares her readings with a health care provider: no.   Nutrition: Patient states that on average she eats 3 cups of fruit and 0 cups of vegetables per day. Patient states that she does not eat fish at least 2 times per week. Patient eats less than half servings of whole grains. Patient drinks less than 36 ounces of beverages with added sugar weekly: yes. Patient is currently watching sodium or salt intake: yes. In the past 7 days patient has consumed drinks containing alcohol on 0 days. On a day that patient consumes drinks containing alcohol on average 0 drinks are consumed.      Physical activity: Patient states that she gets 135 minutes of moderate and 0 minutes of vigorous physical activity each week.  Smoking status: Patient states that she has has never smoked .   Quality of life: Over the past 2 weeks patient states that she had little interest or pleasure in doing things: several days. She has been feeling down, depressed or hopeless:several days.   Social Determinants of Health Assessment:   Computer Use: During the last 12 months  patient states that she has used any of the following: desktop/laptop, smart phone or tablet/other portable wireless computer: yes.   Internet Use: During the last 12 months, did you or any member of your household have access to the internet: Yes, by paying a cell phone company or internet service provider.   Food Insecurities: During the last 12 months, where there any times when you were worried that you would run out of food because of a lack of money or other resources: No.   Transportation Barriers: During the last 12 months, have you missed a doctor's appointment because of transportation problems: No.   Childcare Barriers: If you are currently using childcare services, please identify  the type of services you use. (If not using childcare services, please select Not applicable): not applicable. During the last 12 months, have you had any barriers to childcare services such as: not applicable.   Housing: What is your housing situation today: I have housing.   Intimate Partner Violence: During the last 12 months, how often did your partner physically hurt you: never. During the last 12 months, how often did your partner insult you or talk down to you: never.  Medication Adherence: During the last 12 months, did you ever forget to take your medicine: not applicable. During the last 12 months, were you careless ar times about taking your medicine: not applicable. During the last 12 months, when you felt better did you sometimes stop taking your medication: not applicable. During the last 12 months, sometimes if  you felt worse when you took your medicine did you stop taking it: not applicable.   Risk reduction and counseling:   Health Coaching: Spoke with patient about the daily recommendations for fruits and vegetables. Showed patient what a serving size looks like. Patient does not consume fish often. Explained how fish can be part of a heart healthy diet. Gave suggestions for salmon and  tuna. Patient does not consume many whole grains foods. She does consume whole wheat bread. Gave suggestions for other whole grains that she can try adding into her diet. Patient tries to walk 3-4 days per week for at least 45 minutes at a park near her house.   Goal: Patient will increase fruit and vegetable servings to 2 fruits and 3 vegetables daily. Patient will work on reaching this goal over the next month.    Navigation:  I will notify patient of lab results.  Patient is aware of 2 more health coaching sessions and a follow up. Referred patient to [facility] program with [facility] through [facility] for counseling services.

## 2023-11-04 LAB — HEMOGLOBIN A1C
Est. average glucose Bld gHb Est-mCnc: 108 mg/dL
Hgb A1c MFr Bld: 5.4 % (ref 4.8–5.6)

## 2023-11-04 LAB — LIPID PANEL
Chol/HDL Ratio: 3.6 ratio (ref 0.0–4.4)
Cholesterol, Total: 268 mg/dL — ABNORMAL HIGH (ref 100–199)
HDL: 74 mg/dL (ref >39)
LDL Chol Calc (NIH): 176 mg/dL — ABNORMAL HIGH (ref 0–99)
Triglycerides: 107 mg/dL (ref 0–149)
VLDL Cholesterol Cal: 18 mg/dL (ref 5–40)

## 2023-11-04 LAB — GLUCOSE, RANDOM: Glucose: 94 mg/dL (ref 70–99)

## 2023-11-04 NOTE — Telephone Encounter (Signed)
 Using [employer], [place], [place]: [id number], I have left the pt another message advising pt that I am calling with test results and to please return my call. I have left our call back number and advised the pt to please request to be transferred and if no one is able to answer, to please leave a message.

## 2023-11-08 NOTE — Telephone Encounter (Signed)
 Using [address] [place], [place]: [id number], I have spoken with the pt and advised of her COLPO results and the recommendation to be seen in GYN for a possible LEEP. Pt was also advised that any services received from GYN are not covered by [facility]CP, however I can mail her an application for financial assistance. The pt expressed understanding of this information and had no questions.  I have mailed the pt the financial assistance application in Spanish as well as the contact information for a Spanish speaking financial assistance representative.  Lastly, a referral and staff message has been sent to GYN for follow-up.

## 2023-11-08 NOTE — Addendum Note (Signed)
 Addended by: Jalayia Bagheri K on: 11/08/2023 12:19 PM   Modules accepted: Orders

## 2023-11-09 ENCOUNTER — Telehealth: Payer: Self-pay

## 2023-11-09 NOTE — Telephone Encounter (Signed)
 Left message for patient via [facility] [employer] [phone] about lab results from [facility]. Left name and number for patient to call back.

## 2023-11-10 ENCOUNTER — Ambulatory Visit: Payer: Self-pay

## 2023-11-10 NOTE — Telephone Encounter (Signed)
 Health coaching 2   interpreter- [employer] #   Labs- 268 cholesterol, 176 LDL cholesterol, 107 triglycerides, 74 HDL cholesterol, 5.4 hemoglobin A1C, 94 mean plasma glucose. Patient understands and is aware of her lab results.   Goals-  1. Reduce the amount of fried and fatty foods consumed. Try to grill, bake, broil or sautee in a pan with olive oil.  2. Reduce the amount of red meats consumed. Increase lean protein intake like chicken or fish.  3. Look for low-fat or reduced fat dairy options.  4. Increase whole grain food intake. 5. Daily exercise for 20-30 minutes.    Navigation:  Patient is aware of 1 more health coaching sessions and a follow up. Patient is scheduled for FU with Internal Medicine on 11/15/23 @ 10:15 am.

## 2023-11-14 NOTE — Progress Notes (Unsigned)
 RR:tpdz women visit (Elevated BP, Elevated Cholesterol)  HPI:  [name] is a 58 y.o. female living with a history stated below and presents today for wise woman visit with elevated cholesterol and BP. Please see problem based assessment and plan for additional details.  Past Medical History:  Diagnosis Date   Known health problems: none     Current Outpatient Medications on File Prior to Visit  Medication Sig Dispense Refill   amLODipine  (NORVASC ) 5 MG tablet Take 1 tablet (5 mg total) by mouth daily. (Patient not taking: Reported on 11/03/2023) 90 tablet 1   No current facility-administered medications on file prior to visit.    Family History  Problem Relation Age of Onset   Leukemia Brother    Colon cancer Neg Hx    Breast cancer Neg Hx    CAD Neg Hx     Social History   Socioeconomic History   Marital status: Married    Spouse name: Not on file   Number of children: 2   Years of education: Not on file   Highest education level: Not on file  Occupational History   Occupation: house wife  Tobacco Use   Smoking status: Never   Smokeless tobacco: Never  Vaping Use   Vaping status: Never Used  Substance and Sexual Activity   Alcohol use: Never   Drug use: Never   Sexual activity: Yes    Birth control/protection: Post-menopausal  Other Topics Concern   Not on file  Social History Narrative   From [place]   First marriage at age 56   Social Drivers of Health   Financial Resource Strain: Low Risk  (11/15/2023)   Overall Financial Resource Strain (CARDIA)    Difficulty of Paying Living Expenses: Not very hard  Food Insecurity: No Food Insecurity (10/07/2023)   Hunger Vital Sign    Worried About Running Out of Food in the Last Year: Never true    Ran Out of Food in the Last Year: Never true  Transportation Needs: No Transportation Needs (11/15/2023)   PRAPARE - Administrator, Civil Service (Medical): No    Lack of Transportation  (Non-Medical): No  Physical Activity: Insufficiently Active (11/15/2023)   Exercise Vital Sign    Days of Exercise per Week: 2 days    Minutes of Exercise per Session: 20 min  Stress: No Stress Concern Present (11/15/2023)   [employer] of Occupational Health - Occupational Stress Questionnaire    Feeling of Stress: Not at all  Social Connections: Moderately Integrated (11/15/2023)   Social Connection and Isolation Panel    Frequency of Communication with Friends and Family: More than three times a week    Frequency of Social Gatherings with Friends and Family: More than three times a week    Attends Religious Services: More than 4 times per year    Active Member of [employer] or Organizations: No    Attends Banker Meetings: Never    Marital Status: Married  Catering manager Violence: Not At Risk (11/15/2023)   Humiliation, Afraid, Rape, and Kick questionnaire    Fear of Current or Ex-Partner: No    Emotionally Abused: No    Physically Abused: No    Sexually Abused: No    Review of Systems: ROS negative except for what is noted on the assessment and plan.  Vitals:   11/15/23 1014  BP: (!) 153/89  Pulse: 64  Temp: 97.7 F (36.5 C)  TempSrc: Oral  SpO2: 97%  Weight: 162 lb (73.5 kg)  Height: 5' 3 (1.6 m)    Physical Exam  Physical Exam: Constitutional: well-appearing in no acute distress HENT: normocephalic atraumatic, mucous membranes moist Eyes: conjunctiva non-erythematous Neck: supple Cardiovascular: regular rate and rhythm, no m/r/g Pulmonary/Chest: normal work of breathing on room air, lungs clear to auscultation bilaterally Abdominal: soft, non-tender, non-distended Neurological: alert & oriented x 3, 5/5 strength in bilateral upper and lower extremities, normal gait Skin: warm and dry   Assessment & Plan:   Hypertension, essential BP Readings from Last 3 Encounters:  11/15/23 (!) 153/89  11/03/23 (!) 147/86  10/19/23 (!) 147/99  The  patient has a history of hypertension but has not been taking any antihypertensive medications at home and has not monitored her blood pressure over the past year. She denies symptoms such as headache, chest pain, or vision changes.  Assessment: uncontrolled hypertension. Plan: start Hyzaar  50/12.5 mg daily, order basic metabolic panel (BMP) and she prefers to do it in next visit, instruct patient to monitor blood pressure at home, and schedule follow-up visit in 1 month.  Hyperlipidemia The patient has elevated total cholesterol (268), elevated LDL (176), and HDL of 74. ASCVD 10-year risk score is 5.8%. Due to currently uncontrolled blood pressure, initiation of rosuvastatin has been deferred. Plan is to focus on blood pressure control first and reassess lipid management in one month.    Armando Rossetti M.D [facility] Internal Medicine, PGY-1 Phone: [phone] Date 11/15/2023 Time 3:17 PM

## 2023-11-15 ENCOUNTER — Other Ambulatory Visit: Payer: Self-pay

## 2023-11-15 ENCOUNTER — Ambulatory Visit: Payer: Self-pay

## 2023-11-15 VITALS — BP 153/89 | HR 64 | Temp 97.7°F | Ht 63.0 in | Wt 162.0 lb

## 2023-11-15 DIAGNOSIS — E7800 Pure hypercholesterolemia, unspecified: Secondary | ICD-10-CM

## 2023-11-15 DIAGNOSIS — I10 Essential (primary) hypertension: Secondary | ICD-10-CM

## 2023-11-15 DIAGNOSIS — E785 Hyperlipidemia, unspecified: Secondary | ICD-10-CM | POA: Insufficient documentation

## 2023-11-15 MED ORDER — LOSARTAN POTASSIUM-HCTZ 50-12.5 MG PO TABS
1.0000 | ORAL_TABLET | Freq: Every day | ORAL | 3 refills | Status: AC
  Filled 2023-11-15: qty 90, 90d supply, fill #0

## 2023-11-15 NOTE — Assessment & Plan Note (Addendum)
 BP Readings from Last 3 Encounters:  11/15/23 (!) 153/89  11/03/23 (!) 147/86  10/19/23 (!) 147/99  The patient has a history of hypertension but has not been taking any antihypertensive medications at home and has not monitored her blood pressure over the past year. She denies symptoms such as headache, chest pain, or vision changes.  Assessment: uncontrolled hypertension. Plan: start Hyzaar  50/12.5 mg daily, order basic metabolic panel (BMP) and she prefers to do it in next visit, instruct patient to monitor blood pressure at home, and schedule follow-up visit in 1 month.

## 2023-11-15 NOTE — Patient Instructions (Addendum)
 Gracias, [name], por permitirnos brindarle atencin hoy. Hemos hablado sobre su hipertensin y hemos iniciado tratamiento con Losartn 50 mg combinado con HCTZ 12.5 mg. Por favor, controle su presin arterial en casa regularmente. Tambin, por favor regrese en un mes para una consulta de seguimiento, en la que revisaremos su presin arterial y realizaremos estudios de laboratorio.  Su colesterol no requiere medicacin en este momento, pero seguiremos monitorendolo en futuras visitas.  Si tiene [place] pregunta, estaremos encantados de ayudarle.  He ordenado los [employer] para usted:  rdenes de laboratorio No se ordenaron pruebas de laboratorio hoy.  Referencias ordenadas hoy:  rdenes de referencia No se solicitaron referencias hoy.  He ordenado/ cambiado la siguiente medicacin:  Suspender los siguientes medicamentos: Medicamentos suspendidos durante esta consulta  Losartn (COZAAR ) 50 mg tableta -- Suspendido por el proveedor  [employer] siguientes medicamentos: Medicamentos ordenados en esta consulta  Losartn-hidroclorotiazida (HYZAAR ) 50-12.5 mg tableta Indicaciones: [place] 1 tableta por va oral diariamente. Cantidad: 90 tabletas Repeticiones: 3  Seguimiento: 1 mes  Recuerde:  Si tiene alguna pregunta o inquietud, por favor llame a la clnica de medicina interna al [phone].  Catherina Pates, M.D. [facility] [employer] [facility] [facility]

## 2023-11-15 NOTE — Assessment & Plan Note (Signed)
 The patient has elevated total cholesterol (268), elevated LDL (176), and HDL of 74. ASCVD 10-year risk score is 5.8%. Due to currently uncontrolled blood pressure, initiation of rosuvastatin has been deferred. Plan is to focus on blood pressure control first and reassess lipid management in one month.

## 2023-11-16 NOTE — Progress Notes (Signed)
 Internal Medicine Clinic Attending  I was physically present during the key portions of the resident provided service and participated in the medical decision making of patient's management care. I reviewed pertinent patient test results.  The assessment, diagnosis, and plan were formulated together and I agree with the documentation in the resident's note.  Lovie Clarity, MD   To clarify, since her BP os 153 reflects not being on medicines for hypertension, her ASCVD risk is 4.3%, so a statin is not indicated. We will start treatment for her hypertension today. At next visit, please repeat blood pressure, check BMP, and re-calculate ASCVD score. Statin may be appropriate next visit.   We have given her information for financial assistance.

## 2023-12-28 ENCOUNTER — Encounter

## 2024-01-11 ENCOUNTER — Encounter: Admitting: Obstetrics and Gynecology
